# Patient Record
Sex: Female | Born: 1980
Health system: Southern US, Community
[De-identification: ages and names within clinical notes are randomized; demographics above are authoritative.]

## PROBLEM LIST (undated history)

## (undated) DIAGNOSIS — N390 Urinary tract infection, site not specified: Secondary | ICD-10-CM

## (undated) DIAGNOSIS — F319 Bipolar disorder, unspecified: Secondary | ICD-10-CM

## (undated) HISTORY — PX: TUBAL LIGATION: SHX77

## (undated) HISTORY — PX: TONSILLECTOMY: SUR1361

---

## 2003-04-23 ENCOUNTER — Encounter: Payer: Self-pay | Admitting: *Deleted

## 2003-04-23 ENCOUNTER — Inpatient Hospital Stay (HOSPITAL_COMMUNITY): Admission: AD | Admit: 2003-04-23 | Discharge: 2003-04-23 | Payer: Self-pay | Admitting: *Deleted

## 2008-09-29 ENCOUNTER — Emergency Department (HOSPITAL_COMMUNITY): Admission: EM | Admit: 2008-09-29 | Discharge: 2008-09-29 | Payer: Self-pay | Admitting: Emergency Medicine

## 2008-11-13 ENCOUNTER — Emergency Department (HOSPITAL_BASED_OUTPATIENT_CLINIC_OR_DEPARTMENT_OTHER): Admission: EM | Admit: 2008-11-13 | Discharge: 2008-11-13 | Payer: Self-pay | Admitting: Emergency Medicine

## 2008-11-13 ENCOUNTER — Ambulatory Visit: Payer: Self-pay | Admitting: Diagnostic Radiology

## 2012-09-04 ENCOUNTER — Emergency Department (HOSPITAL_COMMUNITY)
Admission: EM | Admit: 2012-09-04 | Discharge: 2012-09-04 | Disposition: A | Discharge status: Home / Self Care | Payer: Self-pay | Attending: Emergency Medicine | Admitting: Emergency Medicine

## 2012-09-04 ENCOUNTER — Emergency Department (HOSPITAL_COMMUNITY): Payer: Self-pay

## 2012-09-04 ENCOUNTER — Encounter (HOSPITAL_COMMUNITY): Payer: Self-pay | Admitting: Emergency Medicine

## 2012-09-04 DIAGNOSIS — Z3201 Encounter for pregnancy test, result positive: Secondary | ICD-10-CM | POA: Insufficient documentation

## 2012-09-04 DIAGNOSIS — N390 Urinary tract infection, site not specified: Secondary | ICD-10-CM | POA: Insufficient documentation

## 2012-09-04 DIAGNOSIS — Z3490 Encounter for supervision of normal pregnancy, unspecified, unspecified trimester: Secondary | ICD-10-CM

## 2012-09-04 DIAGNOSIS — N760 Acute vaginitis: Secondary | ICD-10-CM | POA: Insufficient documentation

## 2012-09-04 DIAGNOSIS — Z79899 Other long term (current) drug therapy: Secondary | ICD-10-CM | POA: Insufficient documentation

## 2012-09-04 DIAGNOSIS — R300 Dysuria: Secondary | ICD-10-CM | POA: Insufficient documentation

## 2012-09-04 DIAGNOSIS — F17200 Nicotine dependence, unspecified, uncomplicated: Secondary | ICD-10-CM | POA: Insufficient documentation

## 2012-09-04 DIAGNOSIS — N898 Other specified noninflammatory disorders of vagina: Secondary | ICD-10-CM | POA: Insufficient documentation

## 2012-09-04 HISTORY — DX: Urinary tract infection, site not specified: N39.0

## 2012-09-04 LAB — CBC WITH DIFFERENTIAL/PLATELET
Eosinophils Absolute: 0.1 10*3/uL (ref 0.0–0.7)
Eosinophils Relative: 1 % (ref 0–5)
HCT: 35.6 % — ABNORMAL LOW (ref 36.0–46.0)
Hemoglobin: 12.7 g/dL (ref 12.0–15.0)
Lymphs Abs: 2.5 10*3/uL (ref 0.7–4.0)
MCH: 32.1 pg (ref 26.0–34.0)
MCV: 89.9 fL (ref 78.0–100.0)
Monocytes Absolute: 0.4 10*3/uL (ref 0.1–1.0)
Monocytes Relative: 6 % (ref 3–12)
Neutrophils Relative %: 59 % (ref 43–77)
RBC: 3.96 MIL/uL (ref 3.87–5.11)

## 2012-09-04 LAB — WET PREP, GENITAL: Yeast Wet Prep HPF POC: NONE SEEN

## 2012-09-04 LAB — URINE MICROSCOPIC-ADD ON

## 2012-09-04 LAB — POCT I-STAT, CHEM 8
Calcium, Ion: 1.09 mmol/L — ABNORMAL LOW (ref 1.12–1.23)
Creatinine, Ser: 0.5 mg/dL (ref 0.50–1.10)
Glucose, Bld: 82 mg/dL (ref 70–99)
Hemoglobin: 12.6 g/dL (ref 12.0–15.0)
Potassium: 3.9 mEq/L (ref 3.5–5.1)

## 2012-09-04 LAB — URINALYSIS, ROUTINE W REFLEX MICROSCOPIC
Bilirubin Urine: NEGATIVE
Glucose, UA: NEGATIVE mg/dL
Ketones, ur: NEGATIVE mg/dL
Protein, ur: NEGATIVE mg/dL
pH: 6 (ref 5.0–8.0)

## 2012-09-04 MED ORDER — METRONIDAZOLE 500 MG PO TABS: 500.0000 mg | ORAL_TABLET | Freq: Two times a day (BID) | ORAL | Status: DC

## 2012-09-04 MED ORDER — ACETAMINOPHEN 325 MG PO TABS: 650.0000 mg | ORAL_TABLET | Freq: Once | ORAL | Status: DC

## 2012-09-04 MED ORDER — PRENATAL COMPLETE 14-0.4 MG PO TABS: 1.0000 | ORAL_TABLET | Freq: Every day | ORAL | Status: DC

## 2012-09-04 NOTE — ED Notes (Addendum)
Per pt diagnosed with UTI in August, treated with prednisone-states has never gotten better-urine with strong odor and dark in color-chronic lower back pain-positive home pregnancy test

## 2012-09-05 LAB — GC/CHLAMYDIA PROBE AMP
CT Probe RNA: NEGATIVE
GC Probe RNA: NEGATIVE

## 2012-09-05 NOTE — ED Provider Notes (Signed)
History     CSN: [id number]  Arrival date & time 09/04/12  1157   First MD Initiated Contact with Patient 09/04/12 1329      Chief Complaint  Patient presents with  . Urinary Tract Infection  . Dysuria    (Consider location/radiation/quality/duration/timing/severity/associated sxs/prior treatment) Patient is a 31 y.o. female presenting with urinary tract infection and dysuria. The history is provided by the patient. No language interpreter was used.  Urinary Tract Infection This is a recurrent problem. The current episode started more than 1 month ago. The problem occurs daily. The problem has been gradually worsening. Pertinent negatives include no chills, diaphoresis, fever, nausea or vomiting. She has tried NSAIDs for the symptoms.  Dysuria  This is a recurrent problem. The current episode started more than 1 week ago. The problem has been gradually worsening. The pain is at a severity of 4/10. The pain is mild. There has been no fever. Associated symptoms include frequency, possible pregnancy and urgency. Pertinent negatives include no chills, no nausea, no vomiting and no hematuria.  31 yo female with vaginal discharge and + pregnancy test 2 days ago and dysuria.  Her lmp was the first week in November and it was 2 weeks early.  Having bilateral paraspinal lumbar pain since 8/13 when she was treated for a UTI.  Denies CVA tenderness, fever n/v.  No vaginal bleeding. Will proceed with u/a, u-preg, pelvic exam and possible vaginal u/s if pain with pelvic exam.     Past Medical History  Diagnosis Date  . UTI (lower urinary tract infection)     Past Surgical History  Procedure Date  . Tonsillectomy     No family history on file.  History  Substance Use Topics  . Smoking status: Current Every Day Smoker  . Smokeless tobacco: Not on file  . Alcohol Use: No    OB History    Grav Para Term Preterm Abortions TAB SAB Ect Mult Living                  Review of Systems   Constitutional: Negative.  Negative for fever, chills and diaphoresis.  HENT: Negative.   Eyes: Negative.   Respiratory: Negative.  Negative for shortness of breath.   Cardiovascular: Negative.   Gastrointestinal: Negative.  Negative for nausea, vomiting and abdominal distention.  Genitourinary: Positive for dysuria, urgency, frequency and vaginal discharge. Negative for hematuria, difficulty urinating and vaginal pain.  Neurological: Negative.   Psychiatric/Behavioral: Negative.   All other systems reviewed and are negative.    Allergies  Review of patient's allergies indicates no known allergies.  Home Medications   Current Outpatient Rx  Name  Route  Sig  Dispense  Refill  . ALBUTEROL SULFATE HFA 108 (90 BASE) MCG/ACT IN AERS   Inhalation   Inhale 2 puffs into the lungs every 6 (six) hours as needed. For shortness of breath and wheezing         . IBUPROFEN 200 MG PO TABS   Oral   Take 600 mg by mouth every 6 (six) hours as needed. For pain         . METRONIDAZOLE 500 MG PO TABS   Oral   Take 1 tablet (500 mg total) by mouth 2 (two) times daily.   14 tablet   0   . PRENATAL COMPLETE 14-0.4 MG PO TABS   Oral   Take 1 capsule by mouth daily.   60 each   0  BP 115/68  Pulse 71  Temp 98.6 F (37 C) (Oral)  Resp 16  SpO2 100%  LMP 07/24/2012  Physical Exam  Nursing note and vitals reviewed. Constitutional: She is oriented to person, place, and time. She appears well-developed and well-nourished.  HENT:  Head: Normocephalic and atraumatic.  Eyes: Conjunctivae normal and EOM are normal. Pupils are equal, round, and reactive to light.  Neck: Normal range of motion. Neck supple.  Cardiovascular: Normal rate.   Pulmonary/Chest: Effort normal.  Abdominal: Soft.  Genitourinary: Uterus normal. Cervix exhibits discharge. Cervix exhibits no motion tenderness and no friability. Right adnexum displays no tenderness and no fullness. Left adnexum displays  tenderness. Left adnexum displays no mass and no fullness. No bleeding around the vagina. Vaginal discharge found.  Musculoskeletal: Normal range of motion. She exhibits no edema and no tenderness.       Bilateral paraspinal lumbar pain  Neurological: She is alert and oriented to person, place, and time. She has normal reflexes.  Skin: Skin is warm and dry.  Psychiatric: She has a normal mood and affect.    ED Course  Pelvic exam Date/Time: 09/04/2012 1:31 PM Performed by: Remi Haggard Authorized by: Remi Haggard Consent: Verbal consent obtained. Written consent not obtained. Risks and benefits: risks, benefits and alternatives were discussed Consent given by: patient Patient understanding: patient states understanding of the procedure being performed Patient identity confirmed: verbally with patient, arm band and hospital-assigned identification number Time out: Immediately prior to procedure a "time out" was called to verify the correct patient, procedure, equipment, support staff and site/side marked as required. Patient tolerance: Patient tolerated the procedure well with no immediate complications.   (including critical care time)   Spoke with Dr. Eunice Blase and she suggested to treat for BV and not PID since most pregnancies do not have PID.     Labs Reviewed  URINALYSIS, ROUTINE W REFLEX MICROSCOPIC - Abnormal; Notable for the following:    APPearance CLOUDY (*)     Leukocytes, UA TRACE (*)     All other components within normal limits  POCT PREGNANCY, URINE - Abnormal; Notable for the following:    Preg Test, Ur POSITIVE (*)     All other components within normal limits  URINE MICROSCOPIC-ADD ON - Abnormal; Notable for the following:    Bacteria, UA FEW (*)     All other components within normal limits  WET PREP, GENITAL - Abnormal; Notable for the following:    Clue Cells Wet Prep HPF POC MODERATE (*)     WBC, Wet Prep HPF POC MODERATE (*)     All other components  within normal limits  HCG, QUANTITATIVE, PREGNANCY - Abnormal; Notable for the following:    hCG, Beta Chain, Quant, S 14177 (*)     All other components within normal limits  CBC WITH DIFFERENTIAL - Abnormal; Notable for the following:    HCT 35.6 (*)     All other components within normal limits  POCT I-STAT, CHEM 8 - Abnormal; Notable for the following:    Calcium, Ion 1.09 (*)     All other components within normal limits  GC/CHLAMYDIA PROBE AMP   US Ob Comp Less 14 Wks  09/04/2012  *RADIOLOGY REPORT*  Clinical Data: Left lower quadrant abdominal pain.  OBSTETRIC <14 WK [place] AND TRANSVAGINAL OB US  Technique:  Both transabdominal and transvaginal ultrasound examinations were performed for complete evaluation of the gestation as well as the maternal uterus, adnexal regions, and pelvic  cul-de-sac.  Transvaginal technique was performed to assess early pregnancy.  Comparison:  No priors.  Intrauterine gestational sac:  Single gestational sac.  Normal in shape. Yolk sac: Present. Embryo: Present. Cardiac Activity: Present. Heart Rate: 105 bpm  MSD: 2.6 mm  5 w 6 d                [place] EDC: 05/01/2013  Maternal uterus/adnexae: No evidence of subchorionic hemorrhage.  Left ovary is normal in echotexture and appearance.  Within the right ovary there is a 2.1 x 0.8 x 1.0 cm lesion of heterogeneous echotexture that has an appearance compatible with a resolving corpus luteum cyst.  Small amount of free fluid in the cul-de-sac.  IMPRESSION: 1.  Single viable IUP with an estimated gestational age of [redacted] weeks and 6 days and fetal heart rate of 105 beat per minute. 2.  Probable resolving corpus luteum cyst in the right ovary. 3.  Small volume of free fluid the cul-de-sac.   Original Report Authenticated By: Trudie Reed, M.D.    US Ob Transvaginal  09/04/2012  *RADIOLOGY REPORT*  Clinical Data: Left lower quadrant abdominal pain.  OBSTETRIC <14 WK [place] AND TRANSVAGINAL OB US  Technique:  Both transabdominal and  transvaginal ultrasound examinations were performed for complete evaluation of the gestation as well as the maternal uterus, adnexal regions, and pelvic cul-de-sac.  Transvaginal technique was performed to assess early pregnancy.  Comparison:  No priors.  Intrauterine gestational sac:  Single gestational sac.  Normal in shape. Yolk sac: Present. Embryo: Present. Cardiac Activity: Present. Heart Rate: 105 bpm  MSD: 2.6 mm  5 w 6 d                [place] EDC: 05/01/2013  Maternal uterus/adnexae: No evidence of subchorionic hemorrhage.  Left ovary is normal in echotexture and appearance.  Within the right ovary there is a 2.1 x 0.8 x 1.0 cm lesion of heterogeneous echotexture that has an appearance compatible with a resolving corpus luteum cyst.  Small amount of free fluid in the cul-de-sac.  IMPRESSION: 1.  Single viable IUP with an estimated gestational age of [redacted] weeks and 6 days and fetal heart rate of 105 beat per minute. 2.  Probable resolving corpus luteum cyst in the right ovary. 3.  Small volume of free fluid the cul-de-sac.   Original Report Authenticated By: Trudie Reed, M.D.      1. BV (bacterial vaginosis)   2. Pregnancy       MDM  BV + preg with LLQ on pelvic exam.  U/s shows 5w 6d single iable IUP reviewed by myself.  Patient will start prenatal vitamins and follow upwith ob of choice.  She understands to return for pain or bleeding.  Ready for discharge.         Remi Haggard, NP 09/05/12 1936

## 2012-09-07 NOTE — ED Provider Notes (Signed)
Medical screening examination/treatment/procedure(s) were performed by non-physician practitioner and as supervising physician I was immediately available for consultation/collaboration.   Benny Lennert, MD 09/07/12 [phone]

## 2012-09-22 ENCOUNTER — Emergency Department (HOSPITAL_COMMUNITY)
Admission: EM | Admit: 2012-09-22 | Discharge: 2012-09-22 | Disposition: A | Discharge status: Home / Self Care | Payer: Medicaid Other | Attending: Emergency Medicine | Admitting: Emergency Medicine

## 2012-09-22 ENCOUNTER — Encounter (HOSPITAL_COMMUNITY): Payer: Self-pay | Admitting: Emergency Medicine

## 2012-09-22 DIAGNOSIS — Z79899 Other long term (current) drug therapy: Secondary | ICD-10-CM | POA: Insufficient documentation

## 2012-09-22 DIAGNOSIS — Z3490 Encounter for supervision of normal pregnancy, unspecified, unspecified trimester: Secondary | ICD-10-CM

## 2012-09-22 DIAGNOSIS — Z87440 Personal history of urinary (tract) infections: Secondary | ICD-10-CM | POA: Insufficient documentation

## 2012-09-22 DIAGNOSIS — O9989 Other specified diseases and conditions complicating pregnancy, childbirth and the puerperium: Secondary | ICD-10-CM | POA: Insufficient documentation

## 2012-09-22 DIAGNOSIS — F17200 Nicotine dependence, unspecified, uncomplicated: Secondary | ICD-10-CM | POA: Insufficient documentation

## 2012-09-22 DIAGNOSIS — N898 Other specified noninflammatory disorders of vagina: Secondary | ICD-10-CM | POA: Insufficient documentation

## 2012-09-22 LAB — URINE MICROSCOPIC-ADD ON

## 2012-09-22 LAB — URINALYSIS, ROUTINE W REFLEX MICROSCOPIC
Bilirubin Urine: NEGATIVE
Hgb urine dipstick: NEGATIVE
Specific Gravity, Urine: 1.013 (ref 1.005–1.030)
Urobilinogen, UA: 0.2 mg/dL (ref 0.0–1.0)

## 2012-09-22 LAB — WET PREP, GENITAL: Yeast Wet Prep HPF POC: NONE SEEN

## 2012-09-22 NOTE — ED Notes (Signed)
Pt states that she is preg and  Has vaginiosis  Was rx meds and she could not get  It flilled

## 2012-09-22 NOTE — ED Notes (Addendum)
Pt needs paperwork for Medicaid. She has bacterial vaginosis with no means of affording Rx and she needs prenatal vitamins which she cannot afford as well until Medicaid is approved. A&Ox4, ambulatory, nad. Pt states her "kidneys hurt at all times x3 mos". LMP was at the end of first week of Nov 2013.

## 2012-09-22 NOTE — ED Provider Notes (Signed)
History     CSN: [id number]  Arrival date & time 09/22/12  1121   First MD Initiated Contact with Patient 09/22/12 1254      Chief Complaint  Patient presents with  . Vaginal Discharge    (Consider location/radiation/quality/duration/timing/severity/associated sxs/prior treatment) Patient is a 32 y.o. female presenting with vaginal discharge. The history is provided by the patient (pt wants to know if she is pregnant and has a hx of vaginitis and wants that checked). No language interpreter was used.  Vaginal Discharge This is a recurrent problem. The current episode started more than 1 week ago. The problem occurs rarely. The problem has been resolved. Pertinent negatives include no chest pain, no abdominal pain and no headaches. Nothing aggravates the symptoms. Nothing relieves the symptoms.    Past Medical History  Diagnosis Date  . UTI (lower urinary tract infection)     Past Surgical History  Procedure Date  . Tonsillectomy     No family history on file.  History  Substance Use Topics  . Smoking status: Current Every Day Smoker  . Smokeless tobacco: Not on file  . Alcohol Use: No    OB History    Grav Para Term Preterm Abortions TAB SAB Ect Mult Living                  Review of Systems  Constitutional: Negative for fatigue.  HENT: Negative for congestion, sinus pressure and ear discharge.   Eyes: Negative for discharge.  Respiratory: Negative for cough.   Cardiovascular: Negative for chest pain.  Gastrointestinal: Negative for abdominal pain and diarrhea.  Genitourinary: Positive for vaginal discharge. Negative for frequency and hematuria.  Musculoskeletal: Negative for back pain.  Skin: Negative for rash.  Neurological: Negative for seizures and headaches.  Hematological: Negative.   Psychiatric/Behavioral: Negative for hallucinations.    Allergies  Review of patient's allergies indicates no known allergies.  Home Medications   Current  Outpatient Rx  Name  Route  Sig  Dispense  Refill  . ACETAMINOPHEN 500 MG PO TABS   Oral   Take 1,000 mg by mouth every 6 (six) hours as needed. For pain         . ALBUTEROL SULFATE HFA 108 (90 BASE) MCG/ACT IN AERS   Inhalation   Inhale 2 puffs into the lungs every 6 (six) hours as needed. For shortness of breath and wheezing           BP 114/80  Pulse 94  Temp 98.9 F (37.2 C) (Oral)  Resp 14  SpO2 100%  LMP 07/24/2012  Physical Exam  Constitutional: She is oriented to person, place, and time. She appears well-developed.  HENT:  Head: Normocephalic and atraumatic.  Eyes: Conjunctivae normal and EOM are normal. No scleral icterus.  Neck: Neck supple. No thyromegaly present.  Cardiovascular: Normal rate and regular rhythm.  Exam reveals no gallop and no friction rub.   No murmur heard. Pulmonary/Chest: No stridor. She has no wheezes. She has no rales. She exhibits no tenderness.  Abdominal: She exhibits no distension. There is no tenderness. There is no rebound.  Genitourinary: Vaginal discharge found.       Otherwise normal exam  Musculoskeletal: Normal range of motion. She exhibits no edema.  Lymphadenopathy:    She has no cervical adenopathy.  Neurological: She is oriented to person, place, and time. Coordination normal.  Skin: No rash noted. No erythema.  Psychiatric: She has a normal mood and affect. Her behavior is normal.  ED Course  Procedures (including critical care time)  Labs Reviewed  URINALYSIS, ROUTINE W REFLEX MICROSCOPIC - Abnormal; Notable for the following:    APPearance CLOUDY (*)     Leukocytes, UA TRACE (*)     All other components within normal limits  POCT PREGNANCY, URINE - Abnormal; Notable for the following:    Preg Test, Ur POSITIVE (*)     All other components within normal limits  URINE MICROSCOPIC-ADD ON - Abnormal; Notable for the following:    Squamous Epithelial / LPF MANY (*)     All other components within normal limits    WET PREP, GENITAL - Abnormal; Notable for the following:    WBC, Wet Prep HPF POC FEW (*)     All other components within normal limits   No results found.   1. Pregnancy       MDM          Benny Lennert, MD 09/22/12 1517

## 2014-10-01 IMAGING — US US OB TRANSVAGINAL
1 series · 14 of 28 positions shown · non-contrast
Comparison: No priors.

CLINICAL DATA: Left lower quadrant abdominal pain.

OBSTETRIC <14 WK US AND TRANSVAGINAL OB US
TECHNIQUE: Both transabdominal and transvaginal ultrasound
examinations were performed for complete evaluation of the
gestation as well as the maternal uterus, adnexal regions, and
pelvic cul-de-sac.  Transvaginal technique was performed to assess
early pregnancy.

[Series 1: us ob transvaginal · 0.28mm/px · 14 of 101 slices shown]
[im 4/101]
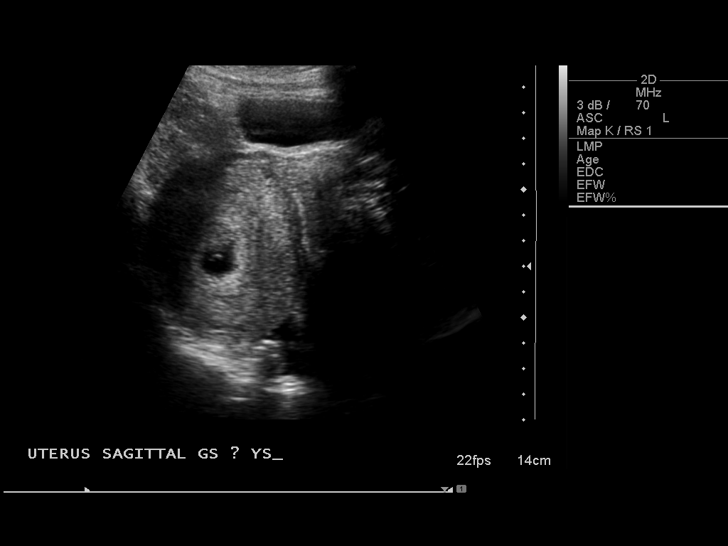
[im 12/101]
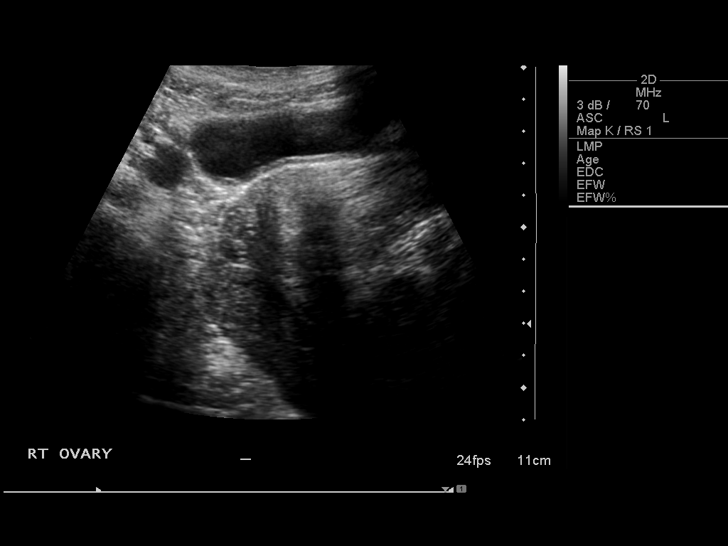
[im 19/101]
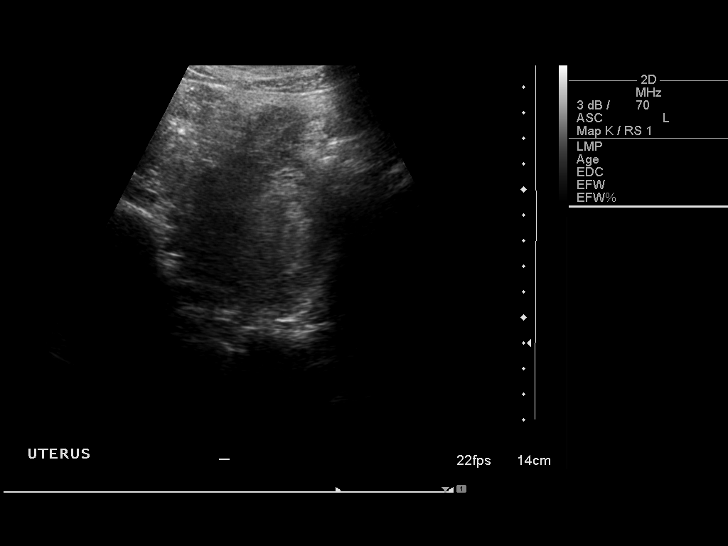
[im 26/101]
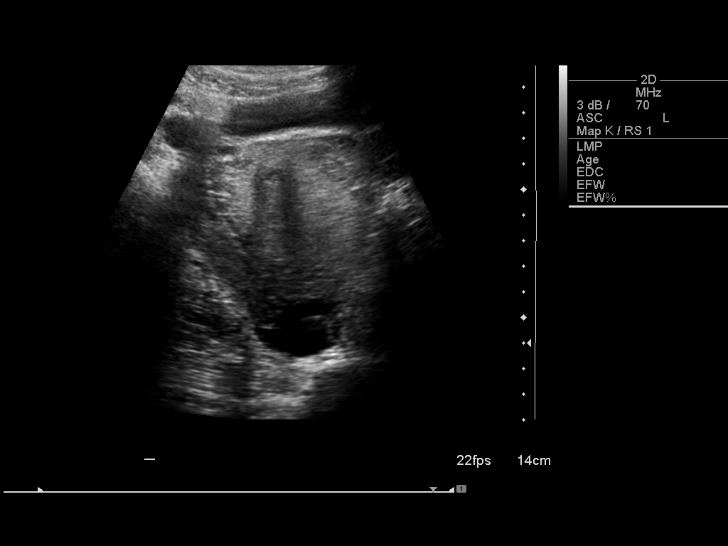
[im 34/101]
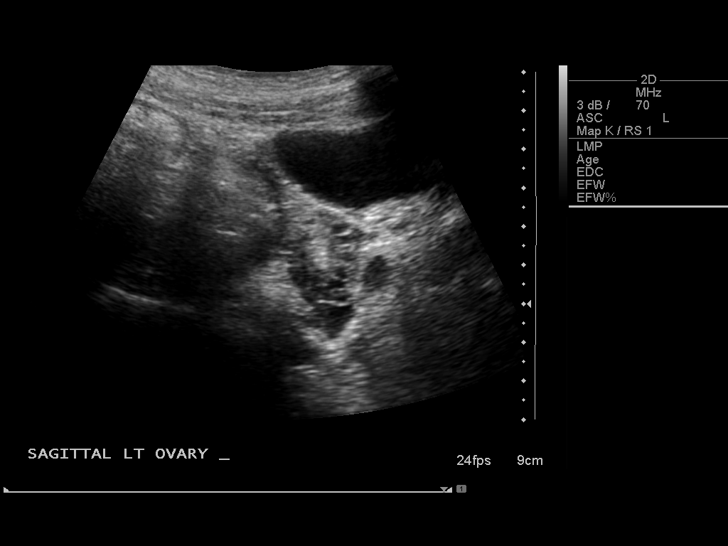
[im 41/101]
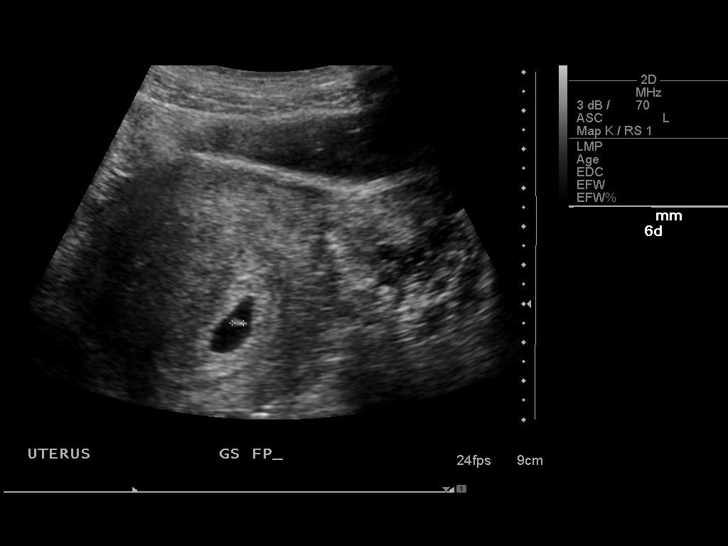
[im 49/101]
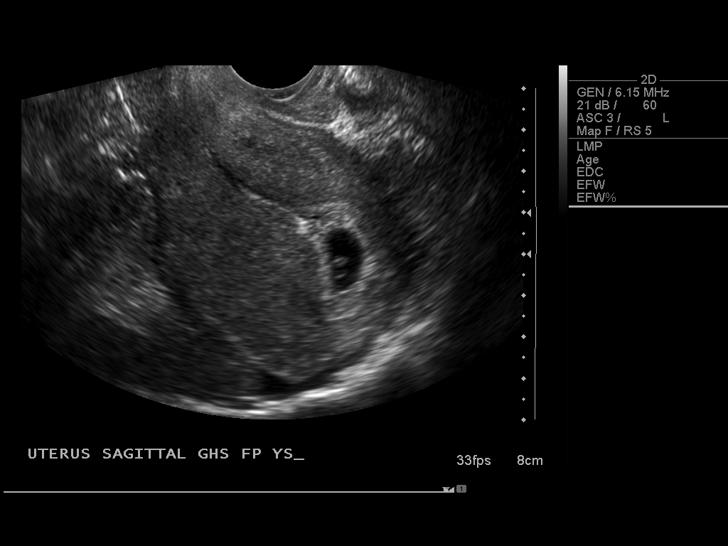
[im 56/101]
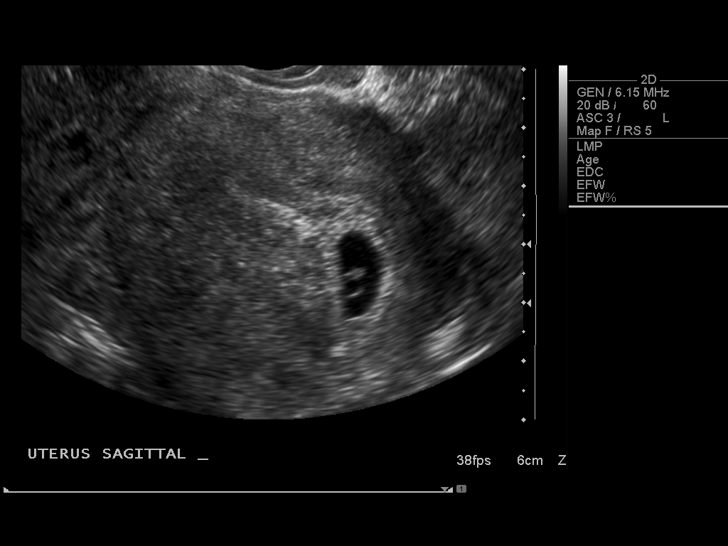
[im 63/101]
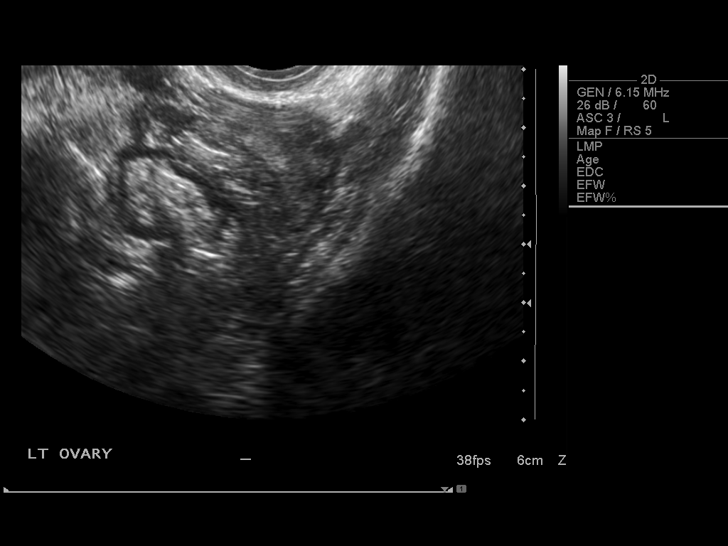
[im 71/101]
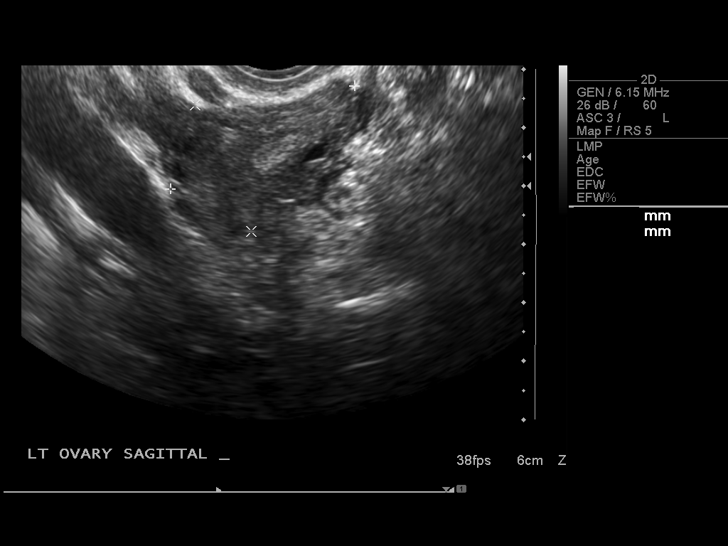
[im 78/101]
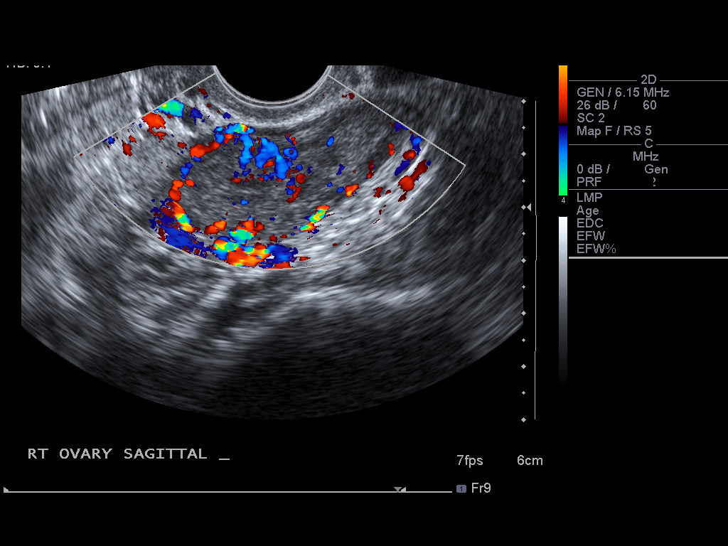
[im 86/101]
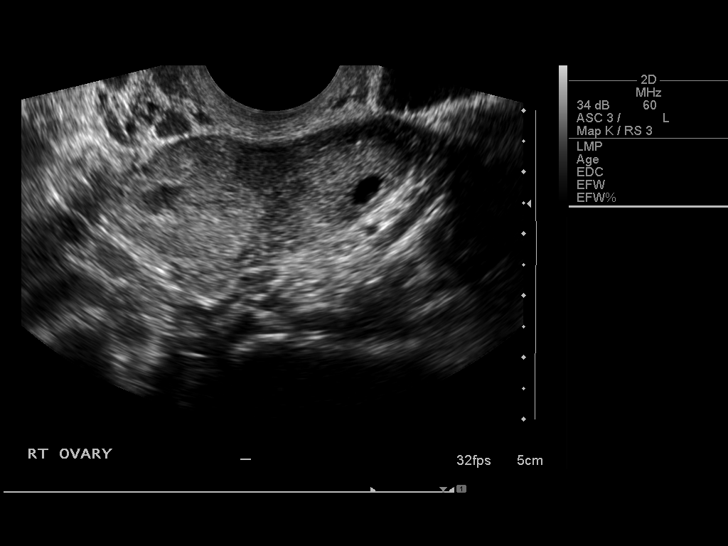
[im 93/101]
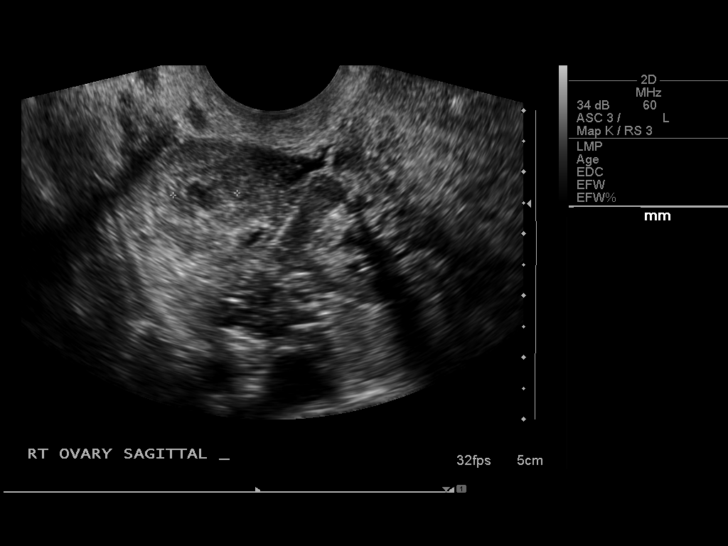
[im 101/101]
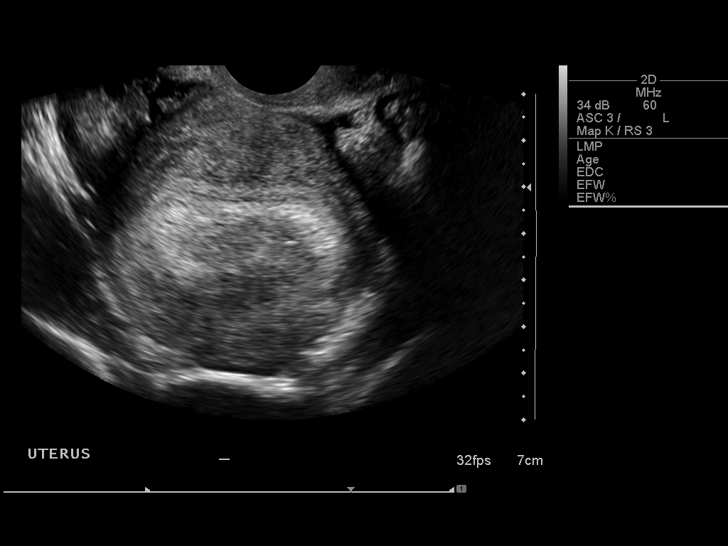

[14 of 28 positions shown; findings below may reference images not displayed]

Intrauterine gestational sac:  Single gestational sac.  Normal in
shape.
Yolk sac: Present.
Embryo: Present.
Cardiac Activity: Present.
Heart Rate: 105 bpm

MSD: 2.6 mm  5 w 6 d                US EDC: 05/01/2013

Maternal uterus/adnexae:
No evidence of subchorionic hemorrhage.  Left ovary is normal in
echotexture and appearance.  Within the right ovary there is a
x 0.8 x 1.0 cm lesion of heterogeneous echotexture that has an
appearance compatible with a resolving corpus luteum cyst.  Small
amount of free fluid in the cul-de-sac.
IMPRESSION: 1.  Single viable IUP with an estimated gestational age of 5 weeks
and 6 days and fetal heart rate of 105 beat per minute.
2.  Probable resolving corpus luteum cyst in the right ovary.
3.  Small volume of free fluid the cul-de-sac.

## 2015-03-19 ENCOUNTER — Encounter (HOSPITAL_COMMUNITY): Payer: Self-pay | Admitting: Physical Medicine and Rehabilitation

## 2015-03-19 ENCOUNTER — Emergency Department (HOSPITAL_COMMUNITY)
Admission: EM | Admit: 2015-03-19 | Discharge: 2015-03-19 | Disposition: A | Discharge status: Home / Self Care | Payer: Medicaid Other | Attending: Emergency Medicine | Admitting: Emergency Medicine

## 2015-03-19 ENCOUNTER — Emergency Department (HOSPITAL_COMMUNITY): Payer: Medicaid Other

## 2015-03-19 DIAGNOSIS — Y9389 Activity, other specified: Secondary | ICD-10-CM | POA: Insufficient documentation

## 2015-03-19 DIAGNOSIS — Y998 Other external cause status: Secondary | ICD-10-CM | POA: Insufficient documentation

## 2015-03-19 DIAGNOSIS — Z87440 Personal history of urinary (tract) infections: Secondary | ICD-10-CM | POA: Insufficient documentation

## 2015-03-19 DIAGNOSIS — S61230A Puncture wound without foreign body of right index finger without damage to nail, initial encounter: Secondary | ICD-10-CM | POA: Diagnosis not present

## 2015-03-19 DIAGNOSIS — Z720 Tobacco use: Secondary | ICD-10-CM | POA: Diagnosis not present

## 2015-03-19 DIAGNOSIS — S61210A Laceration without foreign body of right index finger without damage to nail, initial encounter: Secondary | ICD-10-CM | POA: Diagnosis not present

## 2015-03-19 DIAGNOSIS — W540XXA Bitten by dog, initial encounter: Secondary | ICD-10-CM | POA: Insufficient documentation

## 2015-03-19 DIAGNOSIS — Y9289 Other specified places as the place of occurrence of the external cause: Secondary | ICD-10-CM | POA: Insufficient documentation

## 2015-03-19 DIAGNOSIS — S61250A Open bite of right index finger without damage to nail, initial encounter: Secondary | ICD-10-CM | POA: Diagnosis present

## 2015-03-19 DIAGNOSIS — Z23 Encounter for immunization: Secondary | ICD-10-CM | POA: Diagnosis not present

## 2015-03-19 DIAGNOSIS — Z7951 Long term (current) use of inhaled steroids: Secondary | ICD-10-CM | POA: Diagnosis not present

## 2015-03-19 MED ORDER — TRAMADOL HCL 50 MG PO TABS: 50.0000 mg | ORAL_TABLET | Freq: Four times a day (QID) | ORAL | Status: DC | PRN

## 2015-03-19 MED ORDER — TETANUS-DIPHTH-ACELL PERTUSSIS 5-2.5-18.5 LF-MCG/0.5 IM SUSP
0.5000 mL | Freq: Once | INTRAMUSCULAR | Status: AC
  Administered 2015-03-19: 0.5 mL via INTRAMUSCULAR
  Filled 2015-03-19: qty 0.5

## 2015-03-19 MED ORDER — AMOXICILLIN-POT CLAVULANATE 875-125 MG PO TABS: 1.0000 | ORAL_TABLET | Freq: Two times a day (BID) | ORAL | Status: DC

## 2015-03-19 NOTE — ED Notes (Signed)
Dogbite to right index finger by pt's own dog. Pt reports the dog's shots are UTD. Pt has 4 puncture wounds.

## 2015-03-19 NOTE — ED Provider Notes (Signed)
CSN: [id number]     Arrival date & time 03/19/15  1047 History  This chart was scribed for non-physician practitioner, Santiago GladHeather Rex Oesterle, PA-C working with Geoffery Lyonsouglas Delo, MD by Placido SouLogan Joldersma, ED scribe. This patient was seen in room TR08C/TR08C and the patient's care was started at 12:33 PM.     Chief Complaint  Patient presents with  . Animal Bite   The history is provided by the patient. No language interpreter was used.    HPI Comments: Destiny Bell is a 34 y.o. female who presents to the Emergency Department complaining of a dog bite that occurred 2 hours ago. Pt notes being bit by a mid sized german shepard/pitbull mix resulting in 4 associated puncture wounds with swelling and controlled bleeding to her right index finger. She notes that the dog is UTD on its vaccinations but she is unsure if she's UTD on her tetanus vaccination. Pt denies any other associated symptoms.   Past Medical History  Diagnosis Date  . UTI (lower urinary tract infection)    Past Surgical History  Procedure Laterality Date  . Tonsillectomy     No family history on file. History  Substance Use Topics  . Smoking status: Current Every Day Smoker  . Smokeless tobacco: Not on file  . Alcohol Use: No   OB History    No data available     Review of Systems  Musculoskeletal: Positive for myalgias, joint swelling and arthralgias.  Skin: Positive for wound. Negative for color change and pallor.  Psychiatric/Behavioral: Negative for confusion.      Allergies  Review of patient's allergies indicates no known allergies.  Home Medications   Prior to Admission medications   Medication Sig Start Date End Date Taking? Authorizing Provider  acetaminophen (TYLENOL) 500 MG tablet Take 1,000 mg by mouth every 6 (six) hours as needed. For pain    Historical Provider, MD  albuterol (PROVENTIL HFA;VENTOLIN HFA) 108 (90 BASE) MCG/ACT inhaler Inhale 2 puffs into the lungs every 6 (six) hours as needed. For  shortness of breath and wheezing    Historical Provider, MD   BP 131/83 mmHg  Pulse 78  Temp(Src) 97.7 F (36.5 C) (Oral)  Resp 20  SpO2 100% Physical Exam  Constitutional: She is oriented to person, place, and time. She appears well-developed and well-nourished. No distress.  HENT:  Head: Normocephalic and atraumatic.  Mouth/Throat: Oropharynx is clear and moist.  Eyes: Conjunctivae and EOM are normal. Pupils are equal, round, and reactive to light.  Neck: Normal range of motion. Neck supple. No tracheal deviation present.  Cardiovascular: Normal rate, regular rhythm and intact distal pulses.   2+ radial pulses of right hand  Pulmonary/Chest: Effort normal and breath sounds normal. No respiratory distress.  Abdominal: Soft.  Musculoskeletal: Normal range of motion.  1 cm linear superficial laceration to palmer aspect of right index finger in between the MCP & PIP; no active bleeding; 3 puncture wounds to dorsal aspect of right index finger with mild bleeding.  Full ROM of the right index finger at the level of the MCP, PIP, and DIP.   Neurological: She is alert and oriented to person, place, and time.  Distal sensation of right index finger is intact;   Skin: Skin is warm and dry.  Psychiatric: She has a normal mood and affect. Her behavior is normal.  Nursing note and vitals reviewed.   ED Course  Procedures  DIAGNOSTIC STUDIES: Oxygen Saturation is 100% on RA, normal by my interpretation.  COORDINATION OF CARE: 12:37 PM Discussed treatment plan with pt at bedside and pt agreed to plan.  Labs Review Labs Reviewed - No data to display  Imaging Review Dg Finger Index Right  03/19/2015   CLINICAL DATA:  34 year old female with history of dog bite to the right second finger complaining of pain.  EXAM: RIGHT INDEX FINGER 2+V  COMPARISON:  No priors.  FINDINGS: Extensive soft tissue swelling around the proximal aspect of the right second finger. No retained radiopaque foreign  body in the soft tissues. No acute displaced fracture, subluxation or dislocation.  IMPRESSION: 1. Soft tissue swelling in the proximal aspect of the right second finger, without underlying osseous trauma, and without retained radiopaque foreign body in the soft tissues.   Electronically Signed   By: Trudie Reed M.D.   On: 03/19/2015 13:11     EKG Interpretation None      MDM   Final diagnoses:  None  Patient presents today with a dog bite to the right index finger.  Xray is negative.  Patient neurovascularly intact.  No evidence of tendon injury.  Wound irrigated well.  Finger splinted.  Patient started on prophylactic Augmentin.  Patient stable for discharge.    I personally performed the services described in this documentation, which was scribed in my presence. The recorded information has been reviewed and is accurate.    Santiago Glad, PA-C 03/19/15 1526  Geoffery Lyons, MD 03/19/15 [phone]

## 2015-03-19 NOTE — ED Notes (Signed)
Pt presents to department for evaluation of dog bite to R index finger. Bite marks and swelling noted to finger, bleeding controlled upon arrival. Able to wiggle digit, pulses intact, capillary refill less than 2 seconds. Pt states this was her dog, shots up to date. Tetanus unknown.

## 2015-04-02 ENCOUNTER — Emergency Department (HOSPITAL_BASED_OUTPATIENT_CLINIC_OR_DEPARTMENT_OTHER)
Admission: EM | Admit: 2015-04-02 | Discharge: 2015-04-02 | Disposition: A | Discharge status: Home / Self Care | Payer: Medicaid Other | Attending: Emergency Medicine | Admitting: Emergency Medicine

## 2015-04-02 ENCOUNTER — Encounter (HOSPITAL_BASED_OUTPATIENT_CLINIC_OR_DEPARTMENT_OTHER): Payer: Self-pay

## 2015-04-02 DIAGNOSIS — R21 Rash and other nonspecific skin eruption: Secondary | ICD-10-CM | POA: Diagnosis present

## 2015-04-02 DIAGNOSIS — Z87440 Personal history of urinary (tract) infections: Secondary | ICD-10-CM | POA: Diagnosis not present

## 2015-04-02 DIAGNOSIS — Z720 Tobacco use: Secondary | ICD-10-CM | POA: Insufficient documentation

## 2015-04-02 DIAGNOSIS — B86 Scabies: Secondary | ICD-10-CM

## 2015-04-02 MED ORDER — PERMETHRIN 5 % EX CREA: TOPICAL_CREAM | CUTANEOUS | Status: AC

## 2015-04-02 NOTE — ED Notes (Signed)
MD at bedside. 

## 2015-04-02 NOTE — ED Notes (Signed)
Pt directed to pharmacy to pick up prescriptions-  

## 2015-04-02 NOTE — Discharge Instructions (Signed)

## 2015-04-02 NOTE — ED Provider Notes (Signed)
CSN: [id number]     Arrival date & time 04/02/15  1246 History   First MD Initiated Contact with Patient 04/02/15 1259     Chief Complaint  Patient presents with  . Rash     (Consider location/radiation/quality/duration/timing/severity/associated sxs/prior Treatment) Patient is a 34 y.o. female presenting with rash. The history is provided by the patient.  Rash Location:  Full body Quality: itchiness   Severity:  Mild Onset quality:  Gradual Duration:  3 weeks Timing:  Constant Progression:  Unchanged Chronicity:  New Context comment:  Scabies exposure Relieved by:  Nothing Worsened by:  Nothing tried Ineffective treatments:  None tried Associated symptoms: no abdominal pain, no diarrhea, no fatigue, no fever, no headaches, no nausea, no shortness of breath and not vomiting     Past Medical History  Diagnosis Date  . UTI (lower urinary tract infection)    Past Surgical History  Procedure Laterality Date  . Tonsillectomy    . Tubal ligation     No family history on file. History  Substance Use Topics  . Smoking status: Current Every Day Smoker    Types: Cigarettes  . Smokeless tobacco: Not on file  . Alcohol Use: No   OB History    No data available     Review of Systems  Constitutional: Negative for fever and fatigue.  HENT: Negative for congestion and drooling.   Eyes: Negative for pain.  Respiratory: Negative for cough and shortness of breath.   Cardiovascular: Negative for chest pain.  Gastrointestinal: Negative for nausea, vomiting, abdominal pain and diarrhea.  Genitourinary: Negative for dysuria and hematuria.  Musculoskeletal: Negative for back pain, gait problem and neck pain.  Skin: Positive for rash. Negative for color change.  Neurological: Negative for dizziness and headaches.  Hematological: Negative for adenopathy.  Psychiatric/Behavioral: Negative for behavioral problems.  All other systems reviewed and are negative.     Allergies   Review of patient's allergies indicates no known allergies.  Home Medications   Prior to Admission medications   Not on File   BP 108/81 mmHg  Pulse 93  Temp(Src) 98.7 F (37.1 C) (Oral)  Resp 20  SpO2 100%  LMP 03/31/2015 Physical Exam  Constitutional: She is oriented to person, place, and time. She appears well-developed and well-nourished.  HENT:  Head: Normocephalic.  Mouth/Throat: Oropharynx is clear and moist. No oropharyngeal exudate.  Eyes: Conjunctivae and EOM are normal. Pupils are equal, round, and reactive to light.  Neck: Normal range of motion. Neck supple.  Cardiovascular: Normal rate, regular rhythm, normal heart sounds and intact distal pulses.  Exam reveals no gallop and no friction rub.   No murmur heard. Pulmonary/Chest: Effort normal and breath sounds normal. No respiratory distress. She has no wheezes.  Abdominal: Soft. Bowel sounds are normal. There is no tenderness. There is no rebound and no guarding.  Musculoskeletal: Normal range of motion. She exhibits no edema or tenderness.  Neurological: She is alert and oriented to person, place, and time.  Skin: Skin is warm and dry. Rash noted.   Papular rash  On the hips bilaterally , torso, and sparingly on the extremities. Also seen in the webs of the fingers.  Psychiatric: She has a normal mood and affect. Her behavior is normal.  Nursing note and vitals reviewed.   ED Course  Procedures (including critical care time) Labs Review Labs Reviewed - No data to display  Imaging Review No results found.   EKG Interpretation None  MDM   Final diagnoses:  Scabies    1:22 PM 34 y.o. female  Who presents for a pruritic  Rash which has been ongoing for the last 2-3 weeks. She has the rash is on her trunk and sparingly on her extremities and between the webs of her fingers. Her partner states that she was exposed to scabies when she was staying with him. The rash does appear to be consistent with  this and she is here with her son who also appears to have a similar rash. She denies any fevers or tick exposures. Vital signs unremarkable here. We'll treat with permethrin and provide refills for repeat treatment as needed.    Purvis SheffieldForrest Adalia Pettis, MD 04/02/15 1336

## 2015-04-02 NOTE — ED Notes (Signed)
C/o general rash x 3 weeks-scabies exposure

## 2017-04-14 IMAGING — DX DG FINGER INDEX 2+V*R*
3 series · 3 of 3 positions shown · non-contrast
Comparison: No priors.

CLINICAL DATA: 33-year-old female with history of dog bite to the
right second finger complaining of pain.

EXAM:
RIGHT INDEX FINGER 2+V

[finger ap]
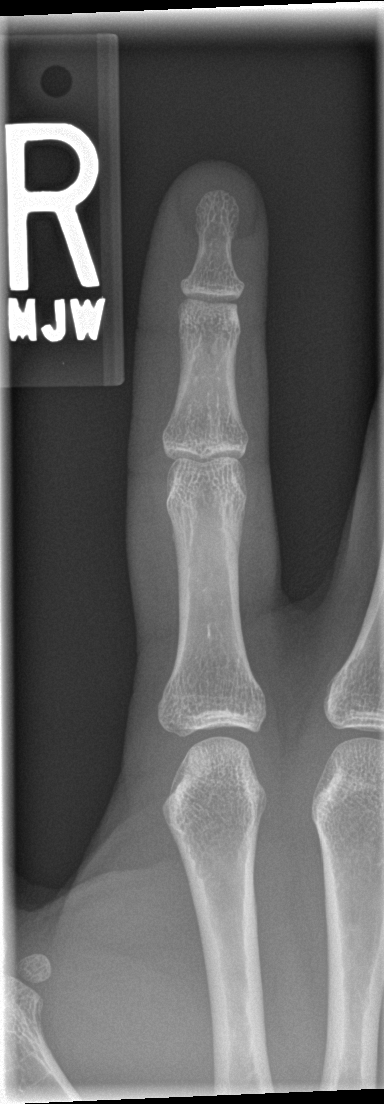

[finger obl]
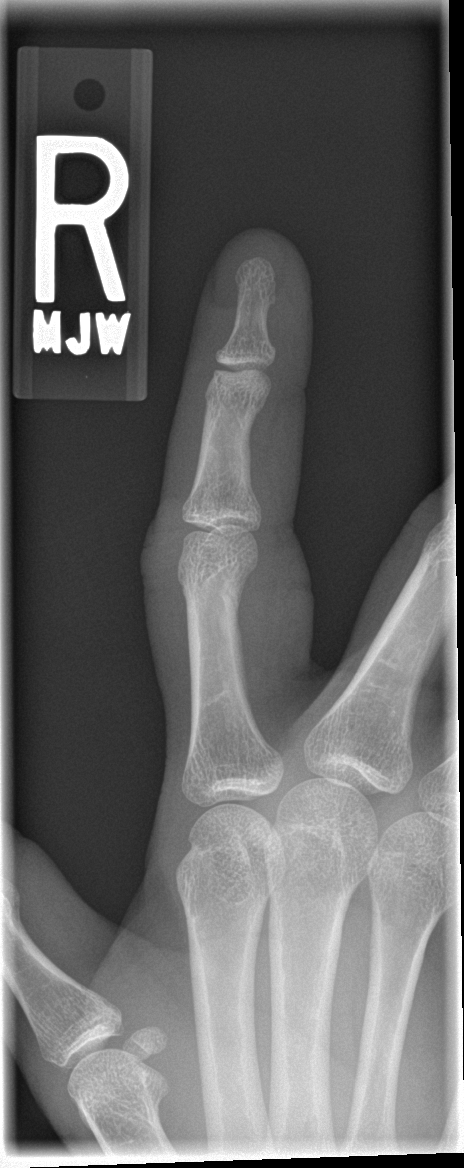

[finger lat]
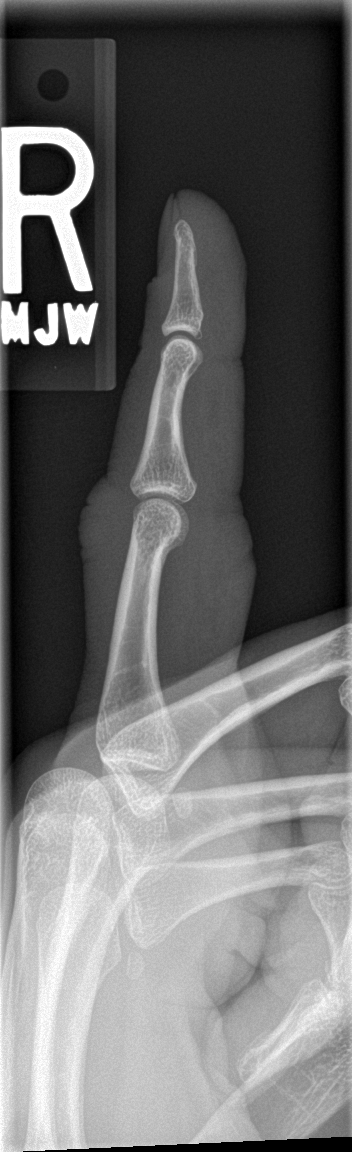

[3 of 3 positions shown; findings below may reference images not displayed]

FINDINGS: Extensive soft tissue swelling around the proximal aspect of the
right second finger. No retained radiopaque foreign body in the soft
tissues. No acute displaced fracture, subluxation or dislocation.
IMPRESSION: 1. Soft tissue swelling in the proximal aspect of the right second
finger, without underlying osseous trauma, and without retained
radiopaque foreign body in the soft tissues.

## 2017-06-19 ENCOUNTER — Emergency Department (HOSPITAL_BASED_OUTPATIENT_CLINIC_OR_DEPARTMENT_OTHER)
Admission: EM | Admit: 2017-06-19 | Discharge: 2017-06-19 | Disposition: A | Discharge status: Home / Self Care | Payer: Medicaid Other | Attending: Emergency Medicine | Admitting: Emergency Medicine

## 2017-06-19 ENCOUNTER — Encounter (HOSPITAL_BASED_OUTPATIENT_CLINIC_OR_DEPARTMENT_OTHER): Payer: Self-pay | Admitting: Emergency Medicine

## 2017-06-19 DIAGNOSIS — H02846 Edema of left eye, unspecified eyelid: Secondary | ICD-10-CM | POA: Insufficient documentation

## 2017-06-19 HISTORY — DX: Bipolar disorder, unspecified: F31.9

## 2017-06-19 NOTE — ED Notes (Signed)
Called for reassessment , no response.

## 2017-06-19 NOTE — ED Triage Notes (Signed)
L eyelid swelling since waking this morning with itching. Took  benadryl PTA.

## 2018-03-09 ENCOUNTER — Other Ambulatory Visit: Payer: Self-pay

## 2018-03-09 ENCOUNTER — Emergency Department (HOSPITAL_BASED_OUTPATIENT_CLINIC_OR_DEPARTMENT_OTHER)
Admission: EM | Admit: 2018-03-09 | Discharge: 2018-03-09 | Disposition: A | Discharge status: Home / Self Care | Payer: Self-pay | Attending: Emergency Medicine | Admitting: Emergency Medicine

## 2018-03-09 ENCOUNTER — Encounter (HOSPITAL_BASED_OUTPATIENT_CLINIC_OR_DEPARTMENT_OTHER): Payer: Self-pay | Admitting: *Deleted

## 2018-03-09 DIAGNOSIS — B9689 Other specified bacterial agents as the cause of diseases classified elsewhere: Secondary | ICD-10-CM

## 2018-03-09 DIAGNOSIS — R102 Pelvic and perineal pain: Secondary | ICD-10-CM | POA: Insufficient documentation

## 2018-03-09 DIAGNOSIS — Z202 Contact with and (suspected) exposure to infections with a predominantly sexual mode of transmission: Secondary | ICD-10-CM | POA: Insufficient documentation

## 2018-03-09 DIAGNOSIS — N760 Acute vaginitis: Secondary | ICD-10-CM | POA: Insufficient documentation

## 2018-03-09 DIAGNOSIS — Z3202 Encounter for pregnancy test, result negative: Secondary | ICD-10-CM | POA: Insufficient documentation

## 2018-03-09 DIAGNOSIS — F17210 Nicotine dependence, cigarettes, uncomplicated: Secondary | ICD-10-CM | POA: Insufficient documentation

## 2018-03-09 DIAGNOSIS — N898 Other specified noninflammatory disorders of vagina: Secondary | ICD-10-CM

## 2018-03-09 LAB — WET PREP, GENITAL
SPERM: NONE SEEN
Trich, Wet Prep: NONE SEEN
Yeast Wet Prep HPF POC: NONE SEEN

## 2018-03-09 LAB — PREGNANCY, URINE: Preg Test, Ur: NEGATIVE

## 2018-03-09 LAB — URINALYSIS, ROUTINE W REFLEX MICROSCOPIC
Bilirubin Urine: NEGATIVE
GLUCOSE, UA: NEGATIVE mg/dL
Hgb urine dipstick: NEGATIVE
KETONES UR: NEGATIVE mg/dL
Leukocytes, UA: NEGATIVE
Nitrite: NEGATIVE
PH: 6.5 (ref 5.0–8.0)
Protein, ur: NEGATIVE mg/dL
Specific Gravity, Urine: 1.01 (ref 1.005–1.030)

## 2018-03-09 MED ORDER — LIDOCAINE HCL (PF) 1 % IJ SOLN
INTRAMUSCULAR | Status: AC
  Filled 2018-03-09: qty 5

## 2018-03-09 MED ORDER — METRONIDAZOLE 500 MG PO TABS: 500.0000 mg | ORAL_TABLET | Freq: Two times a day (BID) | ORAL | 0 refills | Status: DC

## 2018-03-09 MED ORDER — AZITHROMYCIN 250 MG PO TABS
1000.0000 mg | ORAL_TABLET | Freq: Once | ORAL | Status: AC
  Administered 2018-03-09: 1000 mg via ORAL
  Filled 2018-03-09: qty 4

## 2018-03-09 MED ORDER — CEFTRIAXONE SODIUM 250 MG IJ SOLR
250.0000 mg | Freq: Once | INTRAMUSCULAR | Status: AC
Start: 2018-03-09 — End: 2018-03-09
  Administered 2018-03-09: 250 mg via INTRAMUSCULAR
  Filled 2018-03-09: qty 250

## 2018-03-09 MED FILL — metroNIDAZOLE 500 MG TABS: 500 | 7 days supply | Qty: 14 | Fill #0

## 2018-03-09 NOTE — ED Triage Notes (Signed)
Pt reports her ex husband told her he is having symptoms of std, and had unprotected sex with another person while they lived together. Pt states she is having some mild discharge, white, with no odor. She is here requesting std testing.

## 2018-03-09 NOTE — ED Provider Notes (Signed)
[facility] EMERGENCY DEPARTMENT Provider Note   CSN: [id number] Arrival date & time: 03/09/18  1008     History   Chief Complaint Chief Complaint  Patient presents with  . Abdominal Cramping    HPI Destiny Bell is a 37 y.o. female.  HPI   37 year old female with history of bipolar recurrent urinary tract infection presenting requesting for STD screening.  Patient reports 3 weeks ago her significant other told her that he was treated for an STD.  She noticed for the same duration she has had occasional left lower pelvic pain which she described as a sharp achy sensation, more noticeable at nighttime when she rest mild to moderate in severity and improved with ibuprofen.  She also noticed having intermittent vaginal discharge with odor and occasional burning sensation with urination for the past several weeks.  Her symptom is mild to moderate but does concerns her.  No associated fever, chills, nausea, vomiting, diarrhea, constipation, or rash.  Does endorse occasional trace of blood when she wipes but she is unsure if it is coming from her hemorrhoid or heavy vaginal bleeding.  Her last menstruation was the last week of last month.  She has 4 children.  Patient has had 2 different sexual partners within the past 2085-month.  Remote history of gonorrhea and chlamydia infection at the age of 37.  Past Medical History:  Diagnosis Date  . Bipolar 1 disorder (HCC)   . UTI (lower urinary tract infection)     There are no active problems to display for this patient.   Past Surgical History:  Procedure Laterality Date  . TONSILLECTOMY    . TUBAL LIGATION       OB History   None      Home Medications    Prior to Admission medications   Medication Sig Start Date End Date Taking? Authorizing Provider  permethrin (ELIMITE) 5 % cream Thoroughly massage cream from head to soles of feet; leave on for 8 to 14 hours before removing (shower or bath); for infants and the  elderly, also apply on the hairline, neck, scalp, temple, and forehead; may repeat if living mites are observed 14 days after first treatment. 04/02/15   Purvis SheffieldHarrison, Forrest, MD    Family History History reviewed. No pertinent family history.  Social History Social History   Tobacco Use  . Smoking status: Current Every Day Smoker    Types: Cigarettes  . Smokeless tobacco: Never Used  Substance Use Topics  . Alcohol use: No  . Drug use: Yes    Types: Marijuana     Allergies   Patient has no known allergies.   Review of Systems Review of Systems  All other systems reviewed and are negative.    Physical Exam Updated Vital Signs BP 121/84 (BP Location: Right Arm)   Pulse 80   Temp 98.3 F (36.8 C) (Oral)   Resp 18   LMP 02/13/2018   SpO2 100%   Physical Exam  Constitutional: She appears well-developed and well-nourished. No distress.  HENT:  Head: Atraumatic.  Eyes: Conjunctivae are normal.  Neck: Neck supple.  Cardiovascular: Regular rhythm.  Pulmonary/Chest: Effort normal and breath sounds normal.  Abdominal: Soft. Bowel sounds are normal. She exhibits no distension. There is tenderness (Mild tenderness to left lower pelvic region without guarding or rebound tenderness.).  Genitourinary:  Genitourinary Comments: Chaperone present during exam.  No inguinal lymph adenopathy or inguinal hernia noted.  Normal external genitalia.  Mild discomfort with speculum  insertion.  Moderate amount of vaginal discharge noted in vaginal vault.  Cervical os closed without lesion or rash.  On bimanual examination, left adnexal tenderness without cervical motion tenderness.  No obvious mass appreciated.  Neurological: She is alert.  Skin: No rash noted.  Psychiatric: She has a normal mood and affect.  Nursing note and vitals reviewed.    ED Treatments / Results  Labs (all labs ordered are listed, but only abnormal results are displayed) Labs Reviewed  WET PREP, GENITAL -  Abnormal; Notable for the following components:      Result Value   Clue Cells Wet Prep HPF POC PRESENT (*)    WBC, Wet Prep HPF POC MANY (*)    All other components within normal limits  URINALYSIS, ROUTINE W REFLEX MICROSCOPIC  PREGNANCY, URINE  RPR  HIV ANTIBODY (ROUTINE TESTING)  GC/CHLAMYDIA PROBE AMP () NOT AT [facility]    EKG None  Radiology No results found.  Procedures Pelvic exam Date/Time: 03/09/2018 12:25 PM Performed by: Fayrene Helper, PA-C Authorized by: Fayrene Helper, PA-C  Consent: Verbal consent obtained. Risks and benefits: risks, benefits and alternatives were discussed Consent given by: patient Patient understanding: patient states understanding of the procedure being performed Patient consent: the patient's understanding of the procedure matches consent given Patient identity confirmed: verbally with patient Local anesthesia used: no  Anesthesia: Local anesthesia used: no  Sedation: Patient sedated: no  Patient tolerance: Patient tolerated the procedure well with no immediate complications    (including critical care time)  Medications Ordered in ED Medications  lidocaine (PF) (XYLOCAINE) 1 % injection (has no administration in time range)  cefTRIAXone (ROCEPHIN) injection 250 mg (250 mg Intramuscular Given 03/09/18 1104)  azithromycin (ZITHROMAX) tablet 1,000 mg (1,000 mg Oral Given 03/09/18 1104)     Initial Impression / Assessment and Plan / ED Course  I have reviewed the triage vital signs and the nursing notes.  Pertinent labs & imaging results that were available during my care of the patient were reviewed by me and considered in my medical decision making (see chart for details).     BP 121/84 (BP Location: Right Arm)   Pulse 80   Temp 98.3 F (36.8 C) (Oral)   Resp 18   LMP 02/13/2018   SpO2 100%    Final Clinical Impressions(s) / ED Diagnoses   Final diagnoses:  Vaginal discharge  BV (bacterial vaginosis)    ED  Discharge Orders        Ordered    metroNIDAZOLE (FLAGYL) 500 MG tablet  2 times daily     03/09/18 1223     10:35 AM Patient here with lower abdominal cramping associated vaginal discharge in odor and burning on urination.  She also concern for potential STI infection as her partner was treated for an STI 3 weeks ago.  Work-up initiated.  11:02 AM Moderate vaginal discharge noted in vaginal vault and mild left adnexal tenderness on exam.  Given her presenting complaint, patient agreeable with Rocephin and Zithromax prophylactic antibiotic.  12:13 PM Wet prep shows presence of clue cells as well as many WBC.  Urine without signs of urinary tract infection, pregnancy test negative.  Patient discharged home with Flagyl for BV recommend avoid alcohol use while taking this medication.   Fayrene Helper, PA-C 03/09/18 1227    Little, Ambrose Finland, MD 03/09/18 1255

## 2018-03-10 LAB — RPR: RPR Ser Ql: NONREACTIVE

## 2018-03-10 LAB — GC/CHLAMYDIA PROBE AMP (~~LOC~~) NOT AT ARMC
Chlamydia: NEGATIVE
Neisseria Gonorrhea: POSITIVE — AB

## 2018-03-10 LAB — HIV ANTIBODY (ROUTINE TESTING W REFLEX): HIV Screen 4th Generation wRfx: NONREACTIVE

## 2020-04-05 ENCOUNTER — Emergency Department (HOSPITAL_BASED_OUTPATIENT_CLINIC_OR_DEPARTMENT_OTHER)
Admission: EM | Admit: 2020-04-05 | Discharge: 2020-04-05 | Disposition: A | Discharge status: Home / Self Care | Payer: Medicaid Other | Attending: Emergency Medicine | Admitting: Emergency Medicine

## 2020-04-05 ENCOUNTER — Encounter (HOSPITAL_BASED_OUTPATIENT_CLINIC_OR_DEPARTMENT_OTHER): Payer: Self-pay | Admitting: Emergency Medicine

## 2020-04-05 DIAGNOSIS — K625 Hemorrhage of anus and rectum: Secondary | ICD-10-CM | POA: Diagnosis present

## 2020-04-05 DIAGNOSIS — F17210 Nicotine dependence, cigarettes, uncomplicated: Secondary | ICD-10-CM | POA: Insufficient documentation

## 2020-04-05 DIAGNOSIS — R309 Painful micturition, unspecified: Secondary | ICD-10-CM | POA: Diagnosis not present

## 2020-04-05 DIAGNOSIS — A599 Trichomoniasis, unspecified: Secondary | ICD-10-CM | POA: Insufficient documentation

## 2020-04-05 LAB — BASIC METABOLIC PANEL
Anion gap: 10 (ref 5–15)
BUN: 16 mg/dL (ref 6–20)
CO2: 25 mmol/L (ref 22–32)
Calcium: 9.3 mg/dL (ref 8.9–10.3)
Chloride: 104 mmol/L (ref 98–111)
Creatinine, Ser: 0.78 mg/dL (ref 0.44–1.00)
GFR calc Af Amer: >60 mL/min (ref >60)
GFR calc non Af Amer: >60 mL/min (ref >60)
Glucose, Bld: 97 mg/dL (ref 70–99)
Potassium: 3.8 mmol/L (ref 3.5–5.1)
Sodium: 139 mmol/L (ref 135–145)

## 2020-04-05 LAB — CBC WITH DIFFERENTIAL/PLATELET
Abs Immature Granulocytes: 0.01 10*3/uL (ref 0.00–0.07)
Basophils Absolute: 0 10*3/uL (ref 0.0–0.1)
Basophils Relative: 0 %
Eosinophils Absolute: 0.1 10*3/uL (ref 0.0–0.5)
Eosinophils Relative: 1 %
HCT: 41.5 % (ref 36.0–46.0)
Hemoglobin: 13.9 g/dL (ref 12.0–15.0)
Immature Granulocytes: 0 %
Lymphocytes Relative: 39 %
Lymphs Abs: 2.3 10*3/uL (ref 0.7–4.0)
MCH: 32.7 pg (ref 26.0–34.0)
MCHC: 33.5 g/dL (ref 30.0–36.0)
MCV: 97.6 fL (ref 80.0–100.0)
Monocytes Absolute: 0.4 10*3/uL (ref 0.1–1.0)
Monocytes Relative: 7 %
Neutro Abs: 3.1 10*3/uL (ref 1.7–7.7)
Neutrophils Relative %: 53 %
Platelets: 219 10*3/uL (ref 150–400)
RBC: 4.25 MIL/uL (ref 3.87–5.11)
RDW: 12.9 % (ref 11.5–15.5)
WBC: 5.9 10*3/uL (ref 4.0–10.5)
nRBC: 0 % (ref 0.0–0.2)

## 2020-04-05 LAB — URINALYSIS, ROUTINE W REFLEX MICROSCOPIC
Bilirubin Urine: NEGATIVE
Glucose, UA: NEGATIVE mg/dL
Hgb urine dipstick: NEGATIVE
Ketones, ur: NEGATIVE mg/dL
Nitrite: POSITIVE — AB
Protein, ur: NEGATIVE mg/dL
Specific Gravity, Urine: >1.03 — ABNORMAL HIGH (ref 1.005–1.030)
pH: 6 (ref 5.0–8.0)

## 2020-04-05 LAB — WET PREP, GENITAL
Clue Cells Wet Prep HPF POC: NONE SEEN
Sperm: NONE SEEN
Yeast Wet Prep HPF POC: NONE SEEN

## 2020-04-05 LAB — URINALYSIS, MICROSCOPIC (REFLEX)

## 2020-04-05 LAB — OCCULT BLOOD X 1 CARD TO LAB, STOOL: Fecal Occult Bld: POSITIVE — AB

## 2020-04-05 MED ORDER — DOCUSATE SODIUM 250 MG PO CAPS: 250.0000 mg | ORAL_CAPSULE | Freq: Every day | ORAL | 0 refills | Status: AC

## 2020-04-05 MED ORDER — DOXYCYCLINE HYCLATE 100 MG PO TABS: 100.0000 mg | ORAL_TABLET | Freq: Two times a day (BID) | ORAL | 0 refills | Status: AC

## 2020-04-05 MED ORDER — METRONIDAZOLE 500 MG PO TABS
2000.0000 mg | ORAL_TABLET | Freq: Once | ORAL | 0 refills | Status: AC
Start: 2020-04-05 — End: 2020-04-05

## 2020-04-05 MED ORDER — LIDOCAINE HCL (PF) 1 % IJ SOLN
1.0000 mL | Freq: Once | INTRAMUSCULAR | Status: AC
  Administered 2020-04-05: 1 mL
  Filled 2020-04-05: qty 5

## 2020-04-05 MED ORDER — CEFTRIAXONE SODIUM 500 MG IJ SOLR
500.0000 mg | Freq: Once | INTRAMUSCULAR | Status: AC
  Administered 2020-04-05: 500 mg via INTRAMUSCULAR
  Filled 2020-04-05: qty 500

## 2020-04-05 NOTE — ED Notes (Signed)
Pt self collected vaginal swabs per [facility] from Dr. Lynelle Doctor

## 2020-04-05 NOTE — Discharge Instructions (Signed)
Take the medications as prescribed.  Follow-up with your primary care doctor to make sure the infection has cleared.  Follow-up with your GI doctor for further evaluation of the rectal bleeding.

## 2020-04-05 NOTE — ED Triage Notes (Addendum)
Bright red rectal bleeding today. Hx of hemorrhoids. States she would also like her urine checked for a UTI.

## 2020-04-05 NOTE — ED Notes (Signed)
Called lab to add on urine culture ?

## 2020-04-05 NOTE — ED Provider Notes (Signed)
[facility] EMERGENCY DEPARTMENT Provider Note   CSN: [id number] Arrival date & time: 04/05/20  1802     History Chief Complaint  Patient presents with  . Rectal Bleeding    Destiny Bell is a 39 y.o. female.  HPI   Patient presents to the ED for evaluation of intermittent rectal bleeding.  Patient states she feels like she has hemorrhoids.  Occasionally she has discomfort in her rectal area especially when she is pushing.  She has noted some blood in her stool intermittently.  She is not having any abdominal pain.  She is not feeling lightheaded.  She has tried taking Preparation H but her symptoms persist.  Past Medical History:  Diagnosis Date  . Bipolar 1 disorder (HCC)   . UTI (lower urinary tract infection)     There are no problems to display for this patient.   Past Surgical History:  Procedure Laterality Date  . TONSILLECTOMY    . TUBAL LIGATION       OB History   No obstetric history on file.     No family history on file.  Social History   Tobacco Use  . Smoking status: Current Every Day Smoker    Types: Cigarettes  . Smokeless tobacco: Never Used  Substance Use Topics  . Alcohol use: No  . Drug use: Yes    Types: Marijuana    Home Medications Prior to Admission medications   Medication Sig Start Date End Date Taking? Authorizing Provider  docusate sodium (COLACE) 250 MG capsule Take 1 capsule (250 mg total) by mouth daily. 04/05/20   Linwood Dibbles, MD  doxycycline (VIBRA-TABS) 100 MG tablet Take 1 tablet (100 mg total) by mouth 2 (two) times daily. 04/05/20   Linwood Dibbles, MD  metroNIDAZOLE (FLAGYL) 500 MG tablet Take 4 tablets (2,000 mg total) by mouth once for 1 dose. 04/05/20 04/05/20  Linwood Dibbles, MD  permethrin (ELIMITE) 5 % cream Thoroughly massage cream from head to soles of feet; leave on for 8 to 14 hours before removing (shower or bath); for infants and the elderly, also apply on the hairline, neck, scalp, temple, and forehead; may  repeat if living mites are observed 14 days after first treatment. 04/02/15   Purvis Sheffield, MD    Allergies    Patient has no known allergies.  Review of Systems   Review of Systems  All other systems reviewed and are negative.   Physical Exam Updated Vital Signs BP 127/82 (BP Location: Right Arm)   Pulse 75   Temp 98.7 F (37.1 C) (Oral)   Resp 19   Ht 1.702 m (5\' 7" )   Wt 58.8 kg   LMP 03/25/2020   SpO2 100%   BMI 20.30 kg/m   Physical Exam Vitals and nursing note reviewed.  Constitutional:      General: She is not in acute distress.    Appearance: She is well-developed.  HENT:     Head: Normocephalic and atraumatic.     Right Ear: External ear normal.     Left Ear: External ear normal.  Eyes:     General: No scleral icterus.       Right eye: No discharge.        Left eye: No discharge.     Conjunctiva/sclera: Conjunctivae normal.  Neck:     Trachea: No tracheal deviation.  Cardiovascular:     Rate and Rhythm: Normal rate and regular rhythm.  Pulmonary:     Effort: Pulmonary  effort is normal. No respiratory distress.     Breath sounds: Normal breath sounds. No stridor. No wheezing or rales.  Abdominal:     General: Bowel sounds are normal. There is no distension.     Palpations: Abdomen is soft.     Tenderness: There is no abdominal tenderness. There is no guarding or rebound.  Genitourinary:    Comments: External hemorrhoid, no active bleeding, no rectal mass, no gross blood noted on exam Musculoskeletal:        General: No tenderness.     Cervical back: Neck supple.  Skin:    General: Skin is warm and dry.     Findings: No rash.  Neurological:     Mental Status: She is alert.     Cranial Nerves: No cranial nerve deficit (no facial droop, extraocular movements intact, no slurred speech).     Sensory: No sensory deficit.     Motor: No abnormal muscle tone or seizure activity.     Coordination: Coordination normal.     ED Results / Procedures /  Treatments   Labs (all labs ordered are listed, but only abnormal results are displayed) Labs Reviewed  WET PREP, GENITAL - Abnormal; Notable for the following components:      Result Value   Trich, Wet Prep PRESENT (*)    WBC, Wet Prep HPF POC MODERATE (*)    All other components within normal limits  URINALYSIS, ROUTINE W REFLEX MICROSCOPIC - Abnormal; Notable for the following components:   APPearance HAZY (*)    Specific Gravity, Urine >1.030 (*)    Nitrite POSITIVE (*)    Leukocytes,Ua TRACE (*)    All other components within normal limits  URINALYSIS, MICROSCOPIC (REFLEX) - Abnormal; Notable for the following components:   Bacteria, UA MANY (*)    All other components within normal limits  OCCULT BLOOD X 1 CARD TO LAB, STOOL - Abnormal; Notable for the following components:   Fecal Occult Bld POSITIVE (*)    All other components within normal limits  URINE CULTURE  CBC WITH DIFFERENTIAL/PLATELET  BASIC METABOLIC PANEL  RPR  HIV ANTIBODY (ROUTINE TESTING W REFLEX)  GC/CHLAMYDIA PROBE AMP ([place]) NOT AT [facility]    EKG None  Radiology No results found.  Procedures Procedures (including critical care time)  Medications Ordered in ED Medications  cefTRIAXone (ROCEPHIN) injection 500 mg (has no administration in time range)  lidocaine (PF) (XYLOCAINE) 1 % injection 1 mL (has no administration in time range)    ED Course  I have reviewed the triage vital signs and the nursing notes.  Pertinent labs & imaging results that were available during my care of the patient were reviewed by me and considered in my medical decision making (see chart for details).  Clinical Course as of Apr 06 2051  Sat Apr 05, 2020  1913 After I walked out of the room pt asked nurse to also get an STD check. Will have pt self swab while here   [JK]  1951 Urinalysis does suggest UTI.  CBC is normal.  Fecal occult is positive   [JK]    Clinical Course User Index [JK] Linwood Dibbles, MD    MDM Rules/Calculators/A&P                           Patient was concerned about rectal bleeding.  No gross blood noted on exam.  Hemoglobin is normal.  Hemoccult however was trace positive.  Patient did have evidence of an external hemorrhoid but no obvious bleeding hemorrhoid or fissure.  We will have her follow-up with GI for further evaluation.  Patient also complained of urinary discomfort.  She does have trichomonas on exam.  Possible UTI although with the trichomonas we will treat for that and send off her urine culture.  I will also cover for for chlamydia and gonorrhea. Final Clinical Impression(s) / ED Diagnoses Final diagnoses:  Rectal bleeding  Trichomoniasis    Rx / DC Orders ED Discharge Orders         Ordered    doxycycline (VIBRA-TABS) 100 MG tablet  2 times daily     Discontinue  Reprint     04/05/20 2050    metroNIDAZOLE (FLAGYL) 500 MG tablet   Once     Discontinue  Reprint     04/05/20 2050    docusate sodium (COLACE) 250 MG capsule  Daily     Discontinue  Reprint     04/05/20 2050           Linwood Dibbles, MD 04/05/20 2053

## 2020-04-05 NOTE — ED Notes (Signed)
Attempted IV in right antecubital which patient asked to be removed prior to obtaining flash because she preferred it in her hand. Catheter removed intact, dressing applied.

## 2020-04-05 NOTE — ED Notes (Signed)
Pt instructed to wait for med hold after administration of Rocephin. Pt states she cannot wait, she has children at home.

## 2020-04-05 NOTE — ED Notes (Signed)
Dr. Lynelle Doctor at bedside for rectal exam with Eliseo Gum, RN chaperone

## 2020-04-06 LAB — RPR: RPR Ser Ql: NONREACTIVE

## 2020-04-06 LAB — HIV ANTIBODY (ROUTINE TESTING W REFLEX): HIV Screen 4th Generation wRfx: NONREACTIVE

## 2020-04-07 LAB — GC/CHLAMYDIA PROBE AMP (~~LOC~~) NOT AT ARMC
Chlamydia: NEGATIVE
Comment: NEGATIVE
Comment: NORMAL
Neisseria Gonorrhea: NEGATIVE

## 2020-04-08 LAB — URINE CULTURE: Culture: >=100000 — AB

## 2020-04-09 ENCOUNTER — Telehealth: Payer: Self-pay

## 2020-04-09 NOTE — Progress Notes (Signed)
ED Antimicrobial Stewardship Positive Culture Follow Up   Destiny Bell is an 38 y.o. female who presented to [facility] on 04/05/2020 with a chief complaint of  Chief Complaint  Patient presents with  . Rectal Bleeding    Recent Results (from the past 720 hour(s))  Wet prep, genital     Status: Abnormal   Collection Time: 04/05/20  8:14 PM   Specimen: Vaginal; Genital  Result Value Ref Range Status   Yeast Wet Prep HPF POC NONE SEEN NONE SEEN Final   Trich, Wet Prep PRESENT (A) NONE SEEN Final   Clue Cells Wet Prep HPF POC NONE SEEN NONE SEEN Final   WBC, Wet Prep HPF POC MODERATE (A) NONE SEEN Final   Sperm NONE SEEN  Final    Comment: Performed at [facility], [address] [place], [place] [address]  Urine Culture     Status: Abnormal   Collection Time: 04/05/20  8:58 PM   Specimen: Urine, Random  Result Value Ref Range Status   Specimen Description   Final    URINE, RANDOM Performed at [facility], [address] [place], [place] [address]    Special Requests   Final    NONE Performed at [facility], [address] [place], [place] [address]    Culture >=100,000 COLONIES/mL KLEBSIELLA PNEUMONIAE (A)  Final   Report Status 04/08/2020 FINAL  Final   Organism ID, Bacteria KLEBSIELLA PNEUMONIAE (A)  Final      Susceptibility   Klebsiella pneumoniae - MIC*    AMPICILLIN >=32 RESISTANT Resistant     CEFAZOLIN <=4 SENSITIVE Sensitive     CEFTRIAXONE <=0.25 SENSITIVE Sensitive     CIPROFLOXACIN <=0.25 SENSITIVE Sensitive     GENTAMICIN <=1 SENSITIVE Sensitive     IMIPENEM <=0.25 SENSITIVE Sensitive     NITROFURANTOIN 64 INTERMEDIATE Intermediate     TRIMETH/SULFA <=20 SENSITIVE Sensitive     AMPICILLIN/SULBACTAM 4 SENSITIVE Sensitive     PIP/TAZO <=4 SENSITIVE Sensitive     * >=100,000 COLONIES/mL KLEBSIELLA PNEUMONIAE    [x]  Treated with doxycycline and flagyl, organism resistant to prescribed antimicrobial []  Patient  discharged originally without antimicrobial agent and treatment is now indicated  New antibiotic prescription: Symptom check - if pt is having UTI symptoms, DC doxycycline and start bactrim DS 1 tab PO BID x 3 days  ED Provider: , PA-C   Raiyan Dalesandro, 04/09/2020, 9:47 AM Clinical Pharmacist Monday - Friday phone -  [phone] Saturday - Sunday phone - [phone]

## 2020-04-09 NOTE — Telephone Encounter (Signed)
Post ED Visit - Positive Culture Follow-up: Unsuccessful Patient Follow-up  Culture assessed and recommendations reviewed by:  []  , Pharm.D. []  Enzo Bi, Pharm.D., BCPS AQ-ID []  , Pharm.D., BCPS []  Celedonio Miyamoto, .D., BCPS []  [place], .D., BCPS, AAHIVP []  Georgina Pillion, Pharm.D., BCPS, AAHIVP []  [address], PharmD []  , PharmD, BCPS Rachel Rumbarger Pharm D Positive urine culture Symptm check per [place] PA []  Patient discharged without antimicrobial prescription and treatment is now indicated [x]  Organism is resistant to prescribed ED discharge antimicrobial []  Patient with positive blood cultures   May need Bactrim DS  BID x 3 days if symptms Unable to contact patient after 3 attempts, letter will be sent to address on file  04/09/2020, 10:34 AM

## 2020-06-26 ENCOUNTER — Other Ambulatory Visit: Payer: Self-pay

## 2020-06-26 ENCOUNTER — Emergency Department (HOSPITAL_BASED_OUTPATIENT_CLINIC_OR_DEPARTMENT_OTHER)
Admission: EM | Admit: 2020-06-26 | Discharge: 2020-06-26 | Disposition: A | Discharge status: Home / Self Care | Payer: Medicaid Other | Attending: Emergency Medicine | Admitting: Emergency Medicine

## 2020-06-26 ENCOUNTER — Encounter (HOSPITAL_BASED_OUTPATIENT_CLINIC_OR_DEPARTMENT_OTHER): Payer: Self-pay | Admitting: *Deleted

## 2020-06-26 ENCOUNTER — Other Ambulatory Visit (HOSPITAL_BASED_OUTPATIENT_CLINIC_OR_DEPARTMENT_OTHER): Payer: Self-pay | Admitting: Emergency Medicine

## 2020-06-26 DIAGNOSIS — F17210 Nicotine dependence, cigarettes, uncomplicated: Secondary | ICD-10-CM | POA: Insufficient documentation

## 2020-06-26 DIAGNOSIS — M25552 Pain in left hip: Secondary | ICD-10-CM | POA: Insufficient documentation

## 2020-06-26 DIAGNOSIS — M25551 Pain in right hip: Secondary | ICD-10-CM | POA: Insufficient documentation

## 2020-06-26 DIAGNOSIS — M545 Low back pain, unspecified: Secondary | ICD-10-CM | POA: Insufficient documentation

## 2020-06-26 DIAGNOSIS — M5432 Sciatica, left side: Secondary | ICD-10-CM

## 2020-06-26 MED ORDER — TRAMADOL HCL 50 MG PO TABS: 50.0000 mg | ORAL_TABLET | Freq: Four times a day (QID) | ORAL | 0 refills | Status: AC | PRN

## 2020-06-26 MED ORDER — PREDNISONE 50 MG PO TABS
60.0000 mg | ORAL_TABLET | Freq: Once | ORAL | Status: AC
  Administered 2020-06-26: 60 mg via ORAL
  Filled 2020-06-26: qty 1

## 2020-06-26 MED ORDER — PREDNISONE 20 MG PO TABS: ORAL_TABLET | ORAL | 0 refills | Status: AC

## 2020-06-26 MED ORDER — TRAMADOL HCL 50 MG PO TABS
50.0000 mg | ORAL_TABLET | Freq: Once | ORAL | Status: AC
  Administered 2020-06-26: 50 mg via ORAL
  Filled 2020-06-26: qty 1

## 2020-06-26 MED FILL — predniSONE 20 MG TABS: 20 | 6 days supply | Qty: 12 | Fill #0

## 2020-06-26 MED FILL — traMADol HCL 50 MG TABS: 50 | 4 days supply | Qty: 15 | Fill #0

## 2020-06-26 NOTE — ED Triage Notes (Signed)
Back and left hip pain since August.

## 2020-06-26 NOTE — Discharge Instructions (Signed)
Continue Motrin Tylenol for pain.  Add tramadol for severe pain,  Please take prednisone as prescribed to help with sciatica   Please follow-up with neurosurgery for follow-up  Return to ER if you have worse back pain, numbness, weakness, trouble walking.

## 2020-06-26 NOTE — ED Provider Notes (Signed)
[facility] EMERGENCY DEPARTMENT Provider Note   CSN: [id number] Arrival date & time: 06/26/20  1318     History Chief Complaint  Patient presents with   Back Pain   Hip Pain    ARTINA MINELLA is a 39 y.o. female here presenting with back pain and hip pain.  It has been going on for the last several months.  Patient states that it started after she may have strained her back.  States that it progressively got worse.  States that she did not fall into her back.  States that the pain is sharp and radiates into her left thigh.  It is worse when she lays down or sits for prolonged period of time.  Denies any numbness or weakness or trouble urinating.  Patient has been alternating Tylenol and Motrin with no relief.  The history is provided by the patient.       Past Medical History:  Diagnosis Date   Bipolar 1 disorder (HCC)    UTI (lower urinary tract infection)     There are no problems to display for this patient.   Past Surgical History:  Procedure Laterality Date   TONSILLECTOMY     TUBAL LIGATION       OB History   No obstetric history on file.     No family history on file.  Social History   Tobacco Use   Smoking status: Current Every Day Smoker    Types: Cigarettes   Smokeless tobacco: Never Used  Substance Use Topics   Alcohol use: No   Drug use: Yes    Types: Marijuana    Home Medications Prior to Admission medications   Medication Sig Start Date End Date Taking? Authorizing Provider  docusate sodium (COLACE) 250 MG capsule Take 1 capsule (250 mg total) by mouth daily. 04/05/20   Linwood Dibbles, MD  doxycycline (VIBRA-TABS) 100 MG tablet Take 1 tablet (100 mg total) by mouth 2 (two) times daily. 04/05/20   Linwood Dibbles, MD  permethrin (ELIMITE) 5 % cream Thoroughly massage cream from head to soles of feet; leave on for 8 to 14 hours before removing (shower or bath); for infants and the elderly, also apply on the hairline, neck, scalp,  temple, and forehead; may repeat if living mites are observed 14 days after first treatment. 04/02/15   Purvis Sheffield, MD    Allergies    Patient has no known allergies.  Review of Systems   Review of Systems  Musculoskeletal: Positive for back pain.  All other systems reviewed and are negative.   Physical Exam Updated Vital Signs BP (!) 132/91    Pulse 94    Temp 98.1 F (36.7 C) (Oral)    Resp 16    Ht 5\' 7"  (1.702 m)    Wt 59.4 kg    LMP 05/27/2020    SpO2 100%    BMI 20.52 kg/m   Physical Exam Vitals and nursing note reviewed.  Constitutional:      Appearance: Normal appearance.     Comments: Uncomfortable   HENT:     Head: Normocephalic.     Nose: Nose normal.     Mouth/Throat:     Mouth: Mucous membranes are moist.  Eyes:     Extraocular Movements: Extraocular movements intact.     Pupils: Pupils are equal, round, and reactive to light.  Cardiovascular:     Rate and Rhythm: Normal rate and regular rhythm.     Pulses: Normal pulses.  Heart sounds: Normal heart sounds.  Pulmonary:     Effort: Pulmonary effort is normal.  Abdominal:     General: Abdomen is flat.     Palpations: Abdomen is soft.  Musculoskeletal:     Cervical back: Normal range of motion.     Comments: L paralumbar tenderness, nl ROM bilateral hips, + straight leg raise L leg   Skin:    General: Skin is warm.     Capillary Refill: Capillary refill takes less than 2 seconds.  Neurological:     General: No focal deficit present.     Mental Status: She is alert and oriented to person, place, and time.     Comments: No saddle anesthesia.  Positive straight leg raise on the left.  Normal reflexes bilaterally normal sensation and motor strength bilaterally.  Normal gait.  Psychiatric:        Mood and Affect: Mood normal.        Behavior: Behavior normal.     ED Results / Procedures / Treatments   Labs (all labs ordered are listed, but only abnormal results are displayed) Labs Reviewed - No  data to display  EKG None  Radiology No results found.  Procedures Procedures (including critical care time)  Medications Ordered in ED Medications  predniSONE (DELTASONE) tablet 60 mg (has no administration in time range)  traMADol (ULTRAM) tablet 50 mg (has no administration in time range)    ED Course  I have reviewed the triage vital signs and the nursing notes.  Pertinent labs & imaging results that were available during my care of the patient were reviewed by me and considered in my medical decision making (see chart for details).    MDM Rules/Calculators/A&P                         LAKETA SANDOZ is a 39 y.o. female here presenting with back pain radiating down the left hip.  Likely mild sciatica symptoms. Patient is taking Tylenol and Motrin.  Will add tramadol as needed and put her on a steroid pack.  Have her follow-up with spine doctor outpatient for further imaging and treatment options.   Final Clinical Impression(s) / ED Diagnoses Final diagnoses:  None    Rx / DC Orders ED Discharge Orders    None       Charlynne Pander, MD 06/26/20 [phone]
